# Patient Record
Sex: Female | Born: 1955 | Race: White | Hispanic: No | Marital: Married | Smoking: Never smoker
Health system: Southern US, Community
[De-identification: ages and names within clinical notes are randomized; demographics above are authoritative.]

## PROBLEM LIST (undated history)

## (undated) HISTORY — PX: FOOT SURGERY: SHX648

---

## 2015-08-24 ENCOUNTER — Encounter: Payer: Self-pay | Admitting: *Deleted

## 2015-08-25 ENCOUNTER — Ambulatory Visit: Payer: Managed Care, Other (non HMO) | Admitting: *Deleted

## 2015-08-25 ENCOUNTER — Encounter: Payer: Self-pay | Admitting: *Deleted

## 2015-08-25 ENCOUNTER — Ambulatory Visit
Admission: RE | Admit: 2015-08-25 | Discharge: 2015-08-25 | Disposition: A | Discharge status: Home / Self Care | Payer: Managed Care, Other (non HMO) | Source: Ambulatory Visit | Attending: Gastroenterology | Admitting: Gastroenterology

## 2015-08-25 ENCOUNTER — Encounter
Admission: RE | Disposition: A | Discharge status: Home / Self Care | Payer: Self-pay | Source: Ambulatory Visit | Attending: Gastroenterology

## 2015-08-25 DIAGNOSIS — K5730 Diverticulosis of large intestine without perforation or abscess without bleeding: Secondary | ICD-10-CM | POA: Diagnosis not present

## 2015-08-25 DIAGNOSIS — Z885 Allergy status to narcotic agent status: Secondary | ICD-10-CM | POA: Insufficient documentation

## 2015-08-25 DIAGNOSIS — Z1211 Encounter for screening for malignant neoplasm of colon: Secondary | ICD-10-CM | POA: Diagnosis not present

## 2015-08-25 HISTORY — PX: COLONOSCOPY WITH PROPOFOL: SHX5780

## 2015-08-25 SURGERY — COLONOSCOPY WITH PROPOFOL
Anesthesia: General

## 2015-08-25 MED ORDER — LACTATED RINGERS IV SOLN
INTRAVENOUS | Status: DC | PRN
  Administered 2015-08-25: 11:00:00 via INTRAVENOUS

## 2015-08-25 MED ORDER — PROPOFOL 500 MG/50ML IV EMUL
INTRAVENOUS | Status: DC | PRN
  Administered 2015-08-25: 100 ug/kg/min via INTRAVENOUS

## 2015-08-25 MED ORDER — SODIUM CHLORIDE 0.9 % IV SOLN: INTRAVENOUS | Status: DC

## 2015-08-25 NOTE — H&P (Signed)
  Primary Care Physician:  No PCP Per Patient  Pre-Procedure History & Physical: HPI:  Cassandra Weeks is a 59 y.o. female is here for an colonoscopy.   History reviewed. No pertinent past medical history.  Past Surgical History  Procedure Laterality Date  . Foot surgery    . Foot surgery Right   . Cesarean section      Prior to Admission medications   Not on File    Allergies as of 08/03/2015  . (Not on File)    History reviewed. No pertinent family history.  Social History   Social History  . Marital Status: Married    Spouse Name: N/A  . Number of Children: N/A  . Years of Education: N/A   Occupational History  . Not on file.   Social History Main Topics  . Smoking status: Never Smoker   . Smokeless tobacco: Never Used  . Alcohol Use: No  . Drug Use: No  . Sexual Activity: Not on file   Other Topics Concern  . Not on file   Social History Narrative     Physical Exam: BP 145/82 mmHg  Pulse 80  Temp(Src) 98.1 F (36.7 C) (Tympanic)  Resp 10  Ht 5\' 4"  (1.626 m)  Wt 63.504 kg (140 lb)  BMI 24.02 kg/m2  SpO2 100% General:   Alert,  pleasant and cooperative in NAD Head:  Normocephalic and atraumatic. Neck:  Supple; no masses or thyromegaly. Lungs:  Clear throughout to auscultation.    Heart:  Regular rate and rhythm. Abdomen:  Soft, nontender and nondistended. Normal bowel sounds, without guarding, and without rebound.   Neurologic:  Alert and  oriented x4;  grossly normal neurologically.  Impression/Plan: Cassandra Weeks is here for an colonoscopy to be performed for avg risk screening  Risks, benefits, limitations, and alternatives regarding  colonoscopy have been reviewed with the patient.  Questions have been answered.  All parties agreeable.   Elnita MaxwellEIN, MATTHEW GORDON, MD  08/25/2015, 10:22 AM

## 2015-08-25 NOTE — Anesthesia Preprocedure Evaluation (Signed)
Anesthesia Evaluation  Patient identified by MRN, date of birth, ID band Patient awake    Airway Mallampati: II       Dental no notable dental hx.    Pulmonary neg pulmonary ROS,    Pulmonary exam normal        Cardiovascular Normal cardiovascular exam     Neuro/Psych    GI/Hepatic negative GI ROS, Neg liver ROS,   Endo/Other  negative endocrine ROS  Renal/GU negative Renal ROS  negative genitourinary   Musculoskeletal negative musculoskeletal ROS (+)   Abdominal Normal abdominal exam  (+)   Peds negative pediatric ROS (+)  Hematology negative hematology ROS (+)   Anesthesia Other Findings   Reproductive/Obstetrics negative OB ROS                             Anesthesia Physical Anesthesia Plan  ASA: I  Anesthesia Plan: General   Post-op Pain Management:    Induction: Intravenous  Airway Management Planned: Nasal Cannula  Additional Equipment:   Intra-op Plan:   Post-operative Plan:   Informed Consent: I have reviewed the patients History and Physical, chart, labs and discussed the procedure including the risks, benefits and alternatives for the proposed anesthesia with the patient or authorized representative who has indicated his/her understanding and acceptance.     Plan Discussed with: CRNA  Anesthesia Plan Comments:         Anesthesia Quick Evaluation

## 2015-08-25 NOTE — Discharge Instructions (Signed)

## 2015-08-25 NOTE — Op Note (Signed)
[facility]enter[facility] Patient Name: Cassandra Weeks Procedure Date: 08/25/2015 10:24 AM MRN: [id number] Account #: [id number] Date of Birth: 12-Apr-1956 Admit Type: Outpatient Age: 59 Room: [facility] ENDO ROOM 3 Gender: Female Note Status: Finalized Procedure:         Colonoscopy Indications:       Screening for colorectal malignant neoplasm, This is the                     patient's first colonoscopy Patient Profile:   This is a 59 year old female. Providers:         Rhona Raider. Shelle Iron, MD Referring MD:      Dr Burnett Corrente Medicines:         Propofol per Anesthesia Complications:     No immediate complications. Procedure:         Pre-Anesthesia Assessment:                    - Prior to the procedure, a History and Physical was                     performed, and patient medications, allergies and                     sensitivities were reviewed. The patient's tolerance of                     previous anesthesia was reviewed.                    After obtaining informed consent, the colonoscope was                     passed under direct vision. Throughout the procedure, the                     patient's blood pressure, pulse, and oxygen saturations                     were monitored continuously. The Colonoscope was                     introduced through the anus and advanced to the the cecum,                     identified by appendiceal orifice and ileocecal valve. The                     colonoscopy was performed without difficulty. The patient                     tolerated the procedure well. The quality of the bowel                     preparation was excellent. Findings:      The perianal and digital rectal examinations were normal.      A few small-mouthed diverticula were found in the sigmoid colon.      The exam was otherwise without abnormality on direct and retroflexion       views. Impression:        - Diverticulosis in the sigmoid colon.         - The examination was otherwise normal on direct and  retroflexion views.                    - No specimens collected. Recommendation:    - Observe patient in GI recovery unit.                    - High fiber diet.                    - Continue present medications.                    - Repeat colonoscopy in 10 years for screening purposes.                    - Return to referring physician.                    - The findings and recommendations were discussed with the                     patient.                    - The findings and recommendations were discussed with the                     patient's family. Procedure Code(s): --- Professional ---                    [id number], Colonoscopy, flexible; diagnostic, including                     collection of specimen(s) by brushing or washing, when                     performed (separate procedure) CPT copyright 2014 [employer]. All rights reserved. The codes documented in this report are preliminary and upon coder review may  be revised to meet current compliance requirements. Kathalene FramesMatthew G Rein, MD 08/25/2015 10:59:28 AM This report has been signed electronically. Number of Addenda: 0 Note Initiated On: 08/25/2015 10:24 AM Scope Withdrawal Time: 0 hours 12 minutes 17 seconds  Total Procedure Duration: 0 hours 18 minutes 34 seconds       [facility]

## 2015-08-25 NOTE — Transfer of Care (Signed)
Immediate Anesthesia Transfer of Care Note  Patient: Cassandra Weeks  Procedure(s) Performed: Procedure(s): COLONOSCOPY WITH PROPOFOL (N/A)  Patient Location: PACU  Anesthesia Type:General  Level of Consciousness: awake, alert  and oriented  Airway & Oxygen Therapy: Patient Spontanous Breathing and Patient connected to nasal cannula oxygen  Post-op Assessment: Report given to RN and Post -op Vital signs reviewed and stable  Post vital signs: Reviewed and stable  Last Vitals:  Filed Vitals:   08/25/15 1106  BP: 123/83  Pulse: 74  Temp: 36.2 C  Resp:     Complications: No apparent anesthesia complications 

## 2015-08-25 NOTE — Transfer of Care (Signed)
Immediate Anesthesia Transfer of Care Note  Patient: Cassandra Weeks  Procedure(s) Performed: Procedure(s): COLONOSCOPY WITH PROPOFOL (N/A)  Patient Location: PACU  Anesthesia Type:General  Level of Consciousness: awake, alert  and oriented  Airway & Oxygen Therapy: Patient Spontanous Breathing and Patient connected to nasal cannula oxygen  Post-op Assessment: Report given to RN and Post -op Vital signs reviewed and stable  Post vital signs: Reviewed and stable  Last Vitals:  Filed Vitals:   08/25/15 1106  BP: 123/83  Pulse: 74  Temp: 36.2 C  Resp:     Complications: No apparent anesthesia complications

## 2015-08-25 NOTE — Anesthesia Postprocedure Evaluation (Signed)
  Anesthesia Post-op Note  Patient: Cassandra Weeks  Procedure(s) Performed: Procedure(s): COLONOSCOPY WITH PROPOFOL (N/A)  Anesthesia type:General  Patient location: PACU  Post pain: Pain level controlled  Post assessment: Post-op Vital signs reviewed, Patient's Cardiovascular Status Stable, Respiratory Function Stable, Patent Airway and No signs of Nausea or vomiting  Post vital signs: Reviewed and stable  Last Vitals:  Filed Vitals:   08/25/15 1130  BP:   Pulse: 74  Temp:   Resp: 16    Level of consciousness: awake, alert  and patient cooperative  Complications: No apparent anesthesia complications

## 2015-08-28 ENCOUNTER — Encounter: Payer: Self-pay | Admitting: Gastroenterology

## 2015-08-29 ENCOUNTER — Other Ambulatory Visit: Payer: Self-pay | Admitting: Internal Medicine

## 2015-08-29 DIAGNOSIS — Z1231 Encounter for screening mammogram for malignant neoplasm of breast: Secondary | ICD-10-CM

## 2015-09-07 ENCOUNTER — Ambulatory Visit: Payer: Managed Care, Other (non HMO)

## 2015-09-07 NOTE — Addendum Note (Signed)
Addendum  created 09/07/15 0855 by Lezlie OctaveGijsbertus F Van Staveren, MD   Modules edited: Anesthesia Attestations

## 2015-09-12 ENCOUNTER — Ambulatory Visit
Admission: RE | Admit: 2015-09-12 | Discharge: 2015-09-12 | Disposition: A | Discharge status: Home / Self Care | Payer: Managed Care, Other (non HMO) | Source: Ambulatory Visit | Attending: Internal Medicine | Admitting: Internal Medicine

## 2015-09-12 DIAGNOSIS — Z1231 Encounter for screening mammogram for malignant neoplasm of breast: Secondary | ICD-10-CM | POA: Insufficient documentation

## 2015-09-14 ENCOUNTER — Other Ambulatory Visit: Payer: Self-pay | Admitting: Internal Medicine

## 2015-09-14 DIAGNOSIS — R928 Other abnormal and inconclusive findings on diagnostic imaging of breast: Secondary | ICD-10-CM

## 2015-09-30 ENCOUNTER — Ambulatory Visit
Admission: RE | Admit: 2015-09-30 | Discharge: 2015-09-30 | Disposition: A | Discharge status: Home / Self Care | Payer: Managed Care, Other (non HMO) | Source: Ambulatory Visit | Attending: Internal Medicine | Admitting: Internal Medicine

## 2015-09-30 DIAGNOSIS — R928 Other abnormal and inconclusive findings on diagnostic imaging of breast: Secondary | ICD-10-CM | POA: Insufficient documentation

## 2015-09-30 DIAGNOSIS — N6489 Other specified disorders of breast: Secondary | ICD-10-CM | POA: Diagnosis not present

## 2016-03-14 IMAGING — MG MM DIAG BREAST TOMO UNI RIGHT
6 series · 6 of 14 positions shown · non-contrast
Comparison: Prior exams dated 09/12/2015 and 12/11/2001.

CLINICAL DATA: Patient was called back from screening mammogram for
a possible mass in the right breast.

EXAM:
DIGITAL DIAGNOSTIC RIGHT MAMMOGRAM WITH 3D TOMOSYNTHESIS WITH CAD
ULTRASOUND RIGHT BREAST

[R MLO]
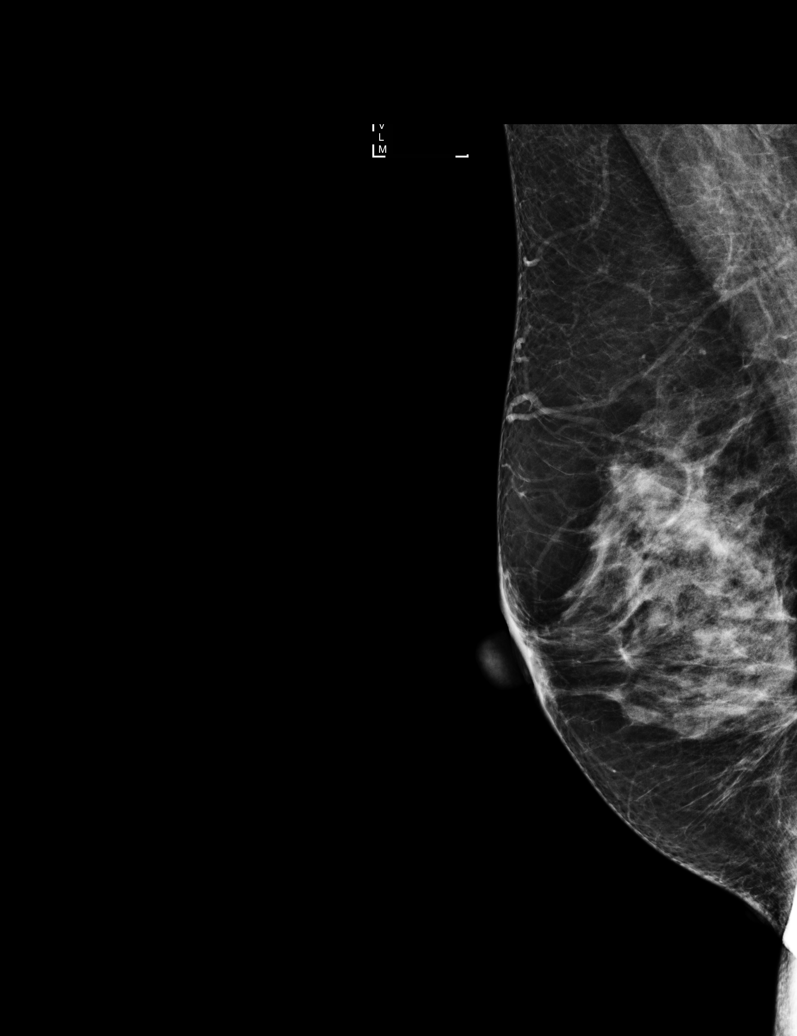

[R CC]
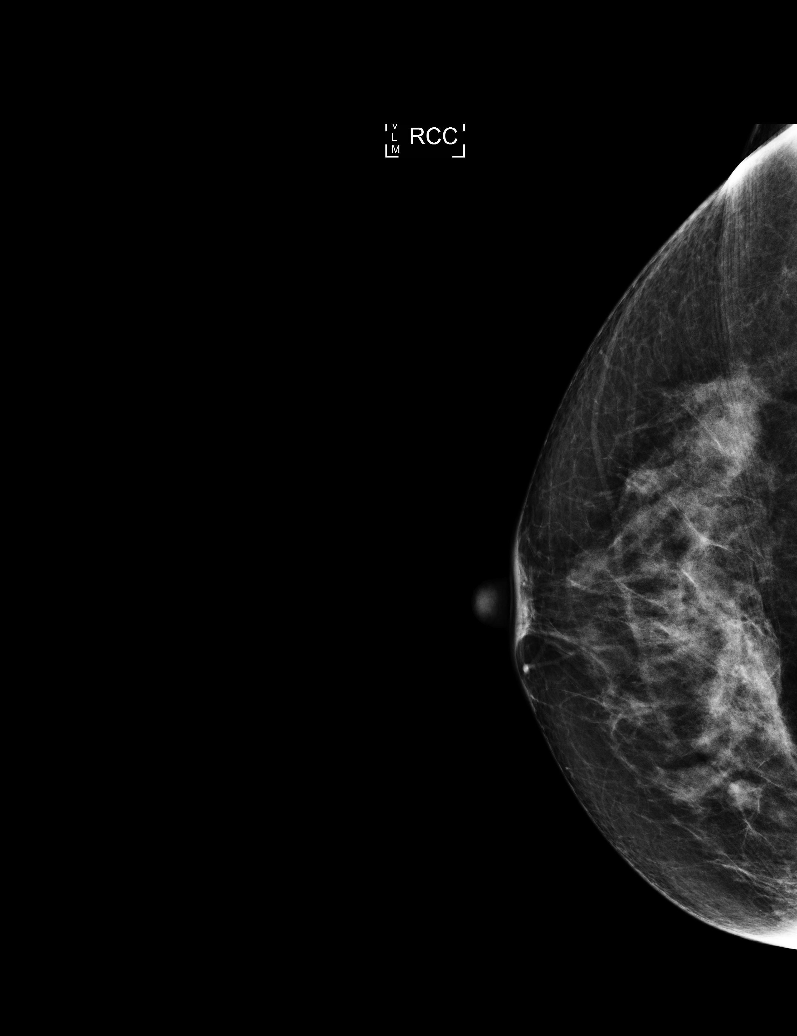

[R CC synth-2D]
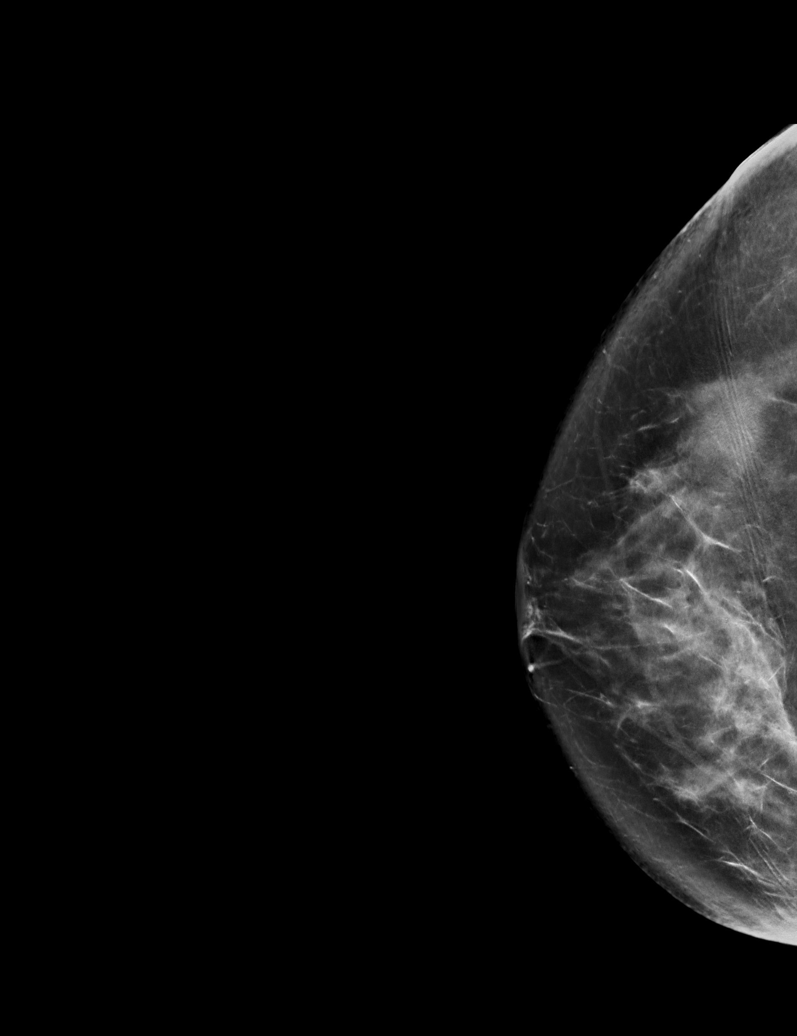

[R MLO synth-2D]
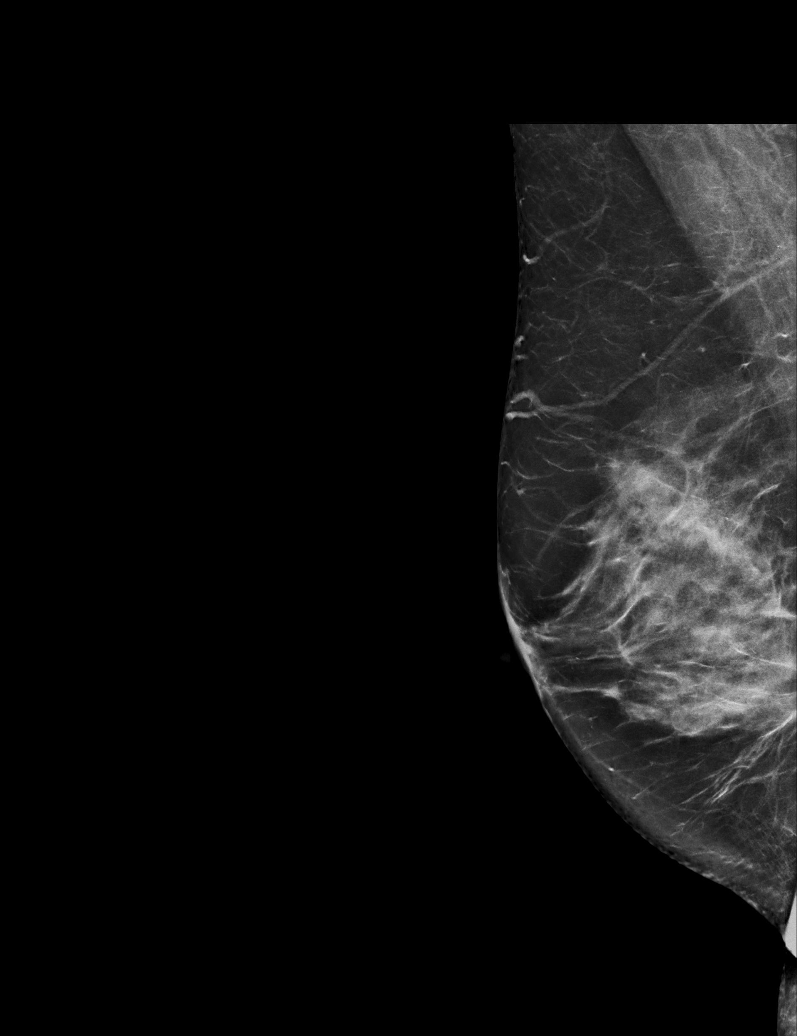

[R MLO tomo · tomo slice 35/70.0]
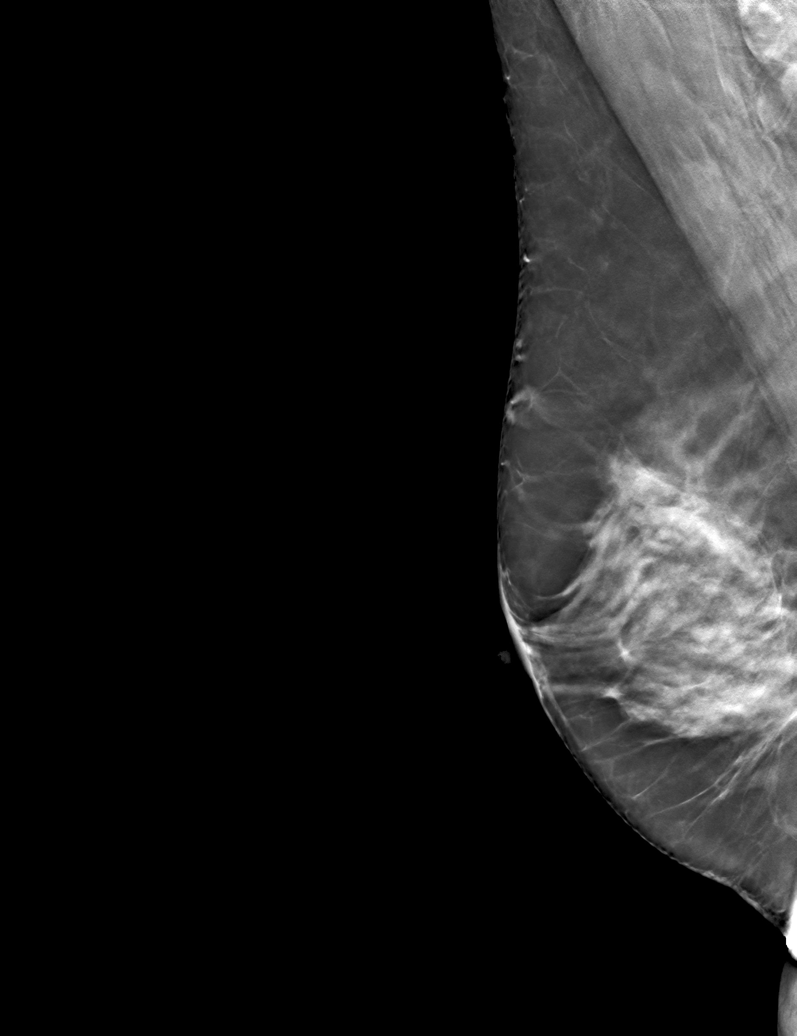

[R CC tomo · tomo slice 39/76.0]
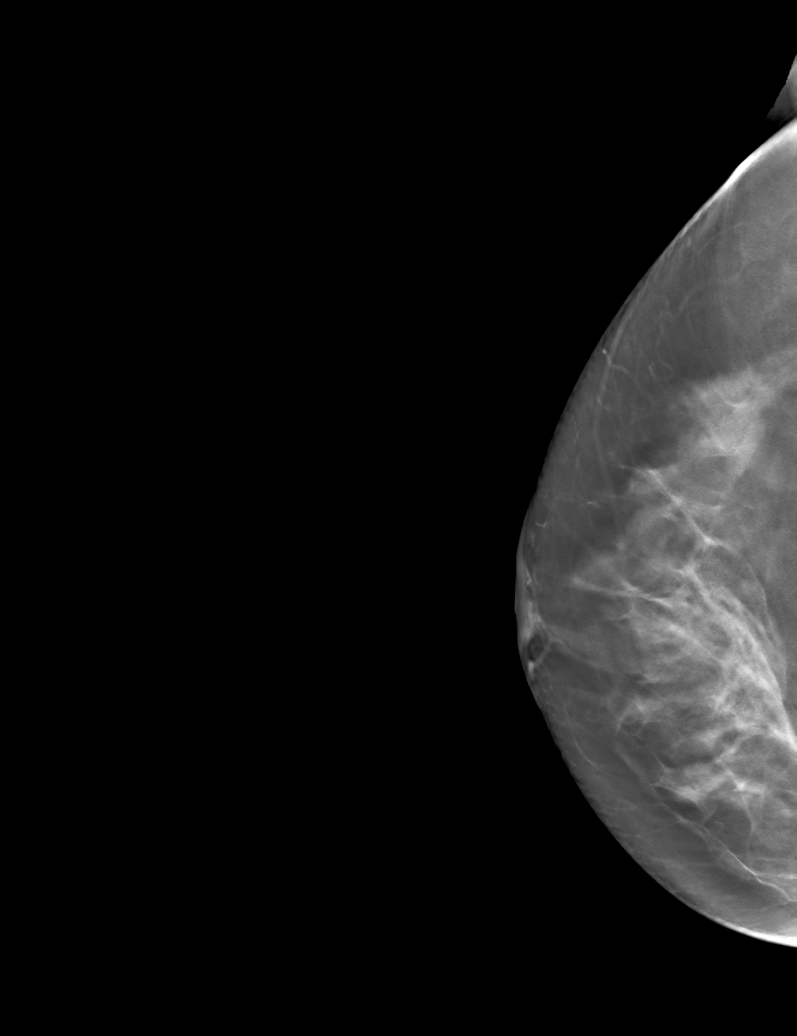

[6 of 14 positions shown; findings below may reference images not displayed]

ACR Breast Density Category c: The breast tissue is heterogeneously
dense, which may obscure small masses.
FINDINGS: Additional imaging of the right breast was performed. In the
upper-outer quadrant there is asymmetric dense fibroglandular tissue
without a discrete mass, distortion or malignant type
microcalcifications identified.

Mammographic images were processed with CAD.

On physical exam, I do not palpate a mass in the upper outer
quadrant of the right breast.

Targeted ultrasound is performed, showing normal breast tissue
throughout the entire lateral aspect of the right breast. No solid
or cystic mass, abnormal shadowing or distortion visualized.
IMPRESSION: Probable benign asymmetric fibroglandular tissue in the upper-outer
quadrant of the right breast.

RECOMMENDATION:
Short-term interval followup right mammogram in 6 months is
recommended.

I have discussed the findings and recommendations with the patient.
Results were also provided in writing at the conclusion of the
visit. If applicable, a reminder letter will be sent to the patient
regarding the next appointment.

BI-RADS CATEGORY  3: Probably benign.

## 2017-08-12 ENCOUNTER — Other Ambulatory Visit: Payer: Self-pay | Admitting: Internal Medicine

## 2017-08-12 DIAGNOSIS — Z0000 Encounter for general adult medical examination without abnormal findings: Secondary | ICD-10-CM

## 2017-08-14 ENCOUNTER — Other Ambulatory Visit: Payer: Self-pay | Admitting: Internal Medicine

## 2017-08-14 DIAGNOSIS — Z0000 Encounter for general adult medical examination without abnormal findings: Secondary | ICD-10-CM

## 2017-09-10 ENCOUNTER — Ambulatory Visit
Admission: RE | Admit: 2017-09-10 | Discharge: 2017-09-10 | Disposition: A | Discharge status: Home / Self Care | Payer: Managed Care, Other (non HMO) | Source: Ambulatory Visit | Attending: Internal Medicine | Admitting: Internal Medicine

## 2017-09-10 DIAGNOSIS — N6489 Other specified disorders of breast: Secondary | ICD-10-CM | POA: Diagnosis not present

## 2017-09-10 DIAGNOSIS — Z0000 Encounter for general adult medical examination without abnormal findings: Secondary | ICD-10-CM | POA: Diagnosis present

## 2017-09-10 DIAGNOSIS — Z0001 Encounter for general adult medical examination with abnormal findings: Secondary | ICD-10-CM | POA: Insufficient documentation

## 2018-10-27 ENCOUNTER — Other Ambulatory Visit: Payer: Self-pay | Admitting: Family Medicine

## 2018-10-27 DIAGNOSIS — R17 Unspecified jaundice: Secondary | ICD-10-CM

## 2018-10-27 DIAGNOSIS — R1011 Right upper quadrant pain: Secondary | ICD-10-CM

## 2018-10-28 ENCOUNTER — Encounter (INDEPENDENT_AMBULATORY_CARE_PROVIDER_SITE_OTHER): Payer: Self-pay

## 2018-10-28 ENCOUNTER — Ambulatory Visit
Admission: RE | Admit: 2018-10-28 | Discharge: 2018-10-28 | Disposition: A | Discharge status: Home / Self Care | Payer: Managed Care, Other (non HMO) | Source: Ambulatory Visit | Attending: Family Medicine | Admitting: Family Medicine

## 2018-10-28 DIAGNOSIS — R1011 Right upper quadrant pain: Secondary | ICD-10-CM | POA: Insufficient documentation

## 2018-10-28 DIAGNOSIS — R17 Unspecified jaundice: Secondary | ICD-10-CM | POA: Diagnosis present

## 2020-10-05 ENCOUNTER — Other Ambulatory Visit: Payer: Self-pay | Admitting: Obstetrics and Gynecology

## 2020-10-05 DIAGNOSIS — Z1231 Encounter for screening mammogram for malignant neoplasm of breast: Secondary | ICD-10-CM

## 2021-10-12 ENCOUNTER — Other Ambulatory Visit: Payer: Self-pay | Admitting: Internal Medicine

## 2021-10-12 DIAGNOSIS — Z1231 Encounter for screening mammogram for malignant neoplasm of breast: Secondary | ICD-10-CM
